# Patient Record
Sex: Female | Born: 1980 | Race: White | Hispanic: No | Marital: Married | Smoking: Never smoker
Health system: Southern US, Community
[De-identification: ages and names within clinical notes are randomized; demographics above are authoritative.]

## PROBLEM LIST (undated history)

## (undated) DIAGNOSIS — Z789 Other specified health status: Secondary | ICD-10-CM

---
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
## 2000-02-21 ENCOUNTER — Other Ambulatory Visit: Admission: RE | Admit: 2000-02-21 | Discharge: 2000-02-21 | Payer: Self-pay | Admitting: *Deleted

## 2001-03-19 ENCOUNTER — Other Ambulatory Visit: Admission: RE | Admit: 2001-03-19 | Discharge: 2001-03-19 | Payer: Self-pay | Admitting: *Deleted

## 2001-11-15 ENCOUNTER — Other Ambulatory Visit: Admission: RE | Admit: 2001-11-15 | Discharge: 2001-11-15 | Payer: Self-pay | Admitting: *Deleted

## 2002-04-14 HISTORY — PX: BACK SURGERY: SHX140

## 2002-09-02 ENCOUNTER — Encounter: Admission: RE | Admit: 2002-09-02 | Discharge: 2002-09-02 | Payer: Self-pay | Admitting: Chiropractic Medicine

## 2002-09-09 ENCOUNTER — Encounter: Payer: Self-pay | Admitting: Neurosurgery

## 2002-09-09 ENCOUNTER — Ambulatory Visit (HOSPITAL_COMMUNITY): Admission: RE | Admit: 2002-09-09 | Discharge: 2002-09-09 | Payer: Self-pay | Admitting: Neurosurgery

## 2002-11-30 ENCOUNTER — Other Ambulatory Visit: Admission: RE | Admit: 2002-11-30 | Discharge: 2002-11-30 | Payer: Self-pay | Admitting: Gynecology

## 2003-12-13 ENCOUNTER — Other Ambulatory Visit: Admission: RE | Admit: 2003-12-13 | Discharge: 2003-12-13 | Payer: Self-pay | Admitting: Gynecology

## 2004-11-25 ENCOUNTER — Other Ambulatory Visit: Admission: RE | Admit: 2004-11-25 | Discharge: 2004-11-25 | Payer: Self-pay | Admitting: Gynecology

## 2005-12-18 ENCOUNTER — Other Ambulatory Visit: Admission: RE | Admit: 2005-12-18 | Discharge: 2005-12-18 | Payer: Self-pay | Admitting: Gynecology

## 2006-12-28 ENCOUNTER — Other Ambulatory Visit: Admission: RE | Admit: 2006-12-28 | Discharge: 2006-12-28 | Payer: Self-pay | Admitting: Gynecology

## 2008-01-04 ENCOUNTER — Inpatient Hospital Stay (HOSPITAL_COMMUNITY): Admission: AD | Admit: 2008-01-04 | Discharge: 2008-01-06 | Payer: Self-pay | Admitting: Obstetrics and Gynecology

## 2008-01-04 ENCOUNTER — Encounter (INDEPENDENT_AMBULATORY_CARE_PROVIDER_SITE_OTHER): Payer: Self-pay | Admitting: Obstetrics and Gynecology

## 2008-01-07 ENCOUNTER — Encounter: Admission: RE | Admit: 2008-01-07 | Discharge: 2008-02-05 | Payer: Self-pay | Admitting: Obstetrics and Gynecology

## 2008-02-06 ENCOUNTER — Encounter: Admission: RE | Admit: 2008-02-06 | Discharge: 2008-03-07 | Payer: Self-pay | Admitting: Obstetrics and Gynecology

## 2008-03-08 ENCOUNTER — Encounter: Admission: RE | Admit: 2008-03-08 | Discharge: 2008-04-06 | Payer: Self-pay | Admitting: Obstetrics and Gynecology

## 2008-04-07 ENCOUNTER — Encounter: Admission: RE | Admit: 2008-04-07 | Discharge: 2008-05-07 | Payer: Self-pay | Admitting: Obstetrics and Gynecology

## 2008-05-08 ENCOUNTER — Encounter: Admission: RE | Admit: 2008-05-08 | Discharge: 2008-06-07 | Payer: Self-pay | Admitting: Obstetrics and Gynecology

## 2008-06-08 ENCOUNTER — Encounter: Admission: RE | Admit: 2008-06-08 | Discharge: 2008-07-05 | Payer: Self-pay | Admitting: Obstetrics and Gynecology

## 2008-07-06 ENCOUNTER — Encounter: Admission: RE | Admit: 2008-07-06 | Discharge: 2008-08-05 | Payer: Self-pay | Admitting: Obstetrics and Gynecology

## 2008-08-06 ENCOUNTER — Encounter: Admission: RE | Admit: 2008-08-06 | Discharge: 2008-09-04 | Payer: Self-pay | Admitting: Obstetrics and Gynecology

## 2008-09-05 ENCOUNTER — Encounter: Admission: RE | Admit: 2008-09-05 | Discharge: 2008-10-05 | Payer: Self-pay | Admitting: Obstetrics and Gynecology

## 2008-10-06 ENCOUNTER — Encounter: Admission: RE | Admit: 2008-10-06 | Discharge: 2008-11-04 | Payer: Self-pay | Admitting: Obstetrics and Gynecology

## 2008-11-05 ENCOUNTER — Encounter: Admission: RE | Admit: 2008-11-05 | Discharge: 2008-12-05 | Payer: Self-pay | Admitting: Obstetrics and Gynecology

## 2008-12-06 ENCOUNTER — Encounter: Admission: RE | Admit: 2008-12-06 | Discharge: 2008-12-12 | Payer: Self-pay | Admitting: Obstetrics and Gynecology

## 2009-07-13 ENCOUNTER — Other Ambulatory Visit: Admission: RE | Admit: 2009-07-13 | Discharge: 2009-07-13 | Payer: Self-pay | Admitting: Gynecology

## 2009-07-13 ENCOUNTER — Ambulatory Visit: Payer: Self-pay | Admitting: Women's Health

## 2010-03-27 ENCOUNTER — Ambulatory Visit: Payer: Self-pay | Admitting: Obstetrics and Gynecology

## 2010-03-27 ENCOUNTER — Inpatient Hospital Stay (HOSPITAL_COMMUNITY)
Admission: AD | Admit: 2010-03-27 | Discharge: 2010-03-27 | Payer: Self-pay | Source: Home / Self Care | Attending: Obstetrics and Gynecology | Admitting: Obstetrics and Gynecology

## 2010-04-03 ENCOUNTER — Ambulatory Visit: Payer: Self-pay | Admitting: Obstetrics and Gynecology

## 2010-04-10 ENCOUNTER — Ambulatory Visit: Payer: Self-pay | Admitting: Obstetrics & Gynecology

## 2010-04-10 ENCOUNTER — Ambulatory Visit: Payer: Self-pay | Admitting: Obstetrics and Gynecology

## 2010-04-17 ENCOUNTER — Ambulatory Visit: Admit: 2010-04-17 | Payer: Self-pay | Admitting: Obstetrics and Gynecology

## 2010-04-17 ENCOUNTER — Ambulatory Visit
Admission: RE | Admit: 2010-04-17 | Discharge: 2010-04-17 | Payer: Self-pay | Source: Home / Self Care | Attending: Obstetrics and Gynecology | Admitting: Obstetrics and Gynecology

## 2010-04-24 ENCOUNTER — Ambulatory Visit
Admission: RE | Admit: 2010-04-24 | Discharge: 2010-04-24 | Payer: Self-pay | Source: Home / Self Care | Attending: Obstetrics and Gynecology | Admitting: Obstetrics and Gynecology

## 2010-04-24 ENCOUNTER — Ambulatory Visit: Admit: 2010-04-24 | Payer: Self-pay | Admitting: Obstetrics and Gynecology

## 2010-05-01 ENCOUNTER — Ambulatory Visit: Admit: 2010-05-01 | Payer: Self-pay | Admitting: Obstetrics and Gynecology

## 2010-05-02 ENCOUNTER — Ambulatory Visit: Admit: 2010-05-02 | Payer: Self-pay | Admitting: Obstetrics and Gynecology

## 2010-05-02 ENCOUNTER — Ambulatory Visit
Admission: RE | Admit: 2010-05-02 | Discharge: 2010-05-02 | Payer: Self-pay | Source: Home / Self Care | Attending: Obstetrics and Gynecology | Admitting: Obstetrics and Gynecology

## 2010-05-04 ENCOUNTER — Inpatient Hospital Stay (HOSPITAL_COMMUNITY)
Admission: AD | Admit: 2010-05-04 | Discharge: 2010-05-07 | Payer: Self-pay | Source: Home / Self Care | Attending: Obstetrics & Gynecology | Admitting: Obstetrics & Gynecology

## 2010-05-04 ENCOUNTER — Encounter (INDEPENDENT_AMBULATORY_CARE_PROVIDER_SITE_OTHER): Payer: Self-pay | Admitting: Obstetrics & Gynecology

## 2010-05-07 ENCOUNTER — Encounter
Admission: RE | Admit: 2010-05-07 | Discharge: 2010-05-14 | Payer: Self-pay | Source: Home / Self Care | Attending: Obstetrics and Gynecology | Admitting: Obstetrics and Gynecology

## 2010-05-07 LAB — CBC
HCT: 31.7 % — ABNORMAL LOW (ref 36.0–46.0)
Hemoglobin: 7.8 g/dL — ABNORMAL LOW (ref 12.0–15.0)
MCH: 27.9 pg (ref 26.0–34.0)
MCHC: 32.5 g/dL (ref 30.0–36.0)
MCHC: 32.5 g/dL (ref 30.0–36.0)
RDW: 13.2 % (ref 11.5–15.5)

## 2010-05-07 LAB — SAMPLE TO BLOOD BANK

## 2010-05-22 NOTE — Op Note (Signed)
Kristina Fernandez, Kristina Fernandez                 ACCOUNT NO.:  [id number]  MEDICAL RECORD NO.:  [id number]          PATIENT TYPE:  INP  LOCATION:  9104                          FACILITY:  [facility]  PHYSICIAN:  Maxie Better, M.D.DATE OF BIRTH:  07-May-1980  DATE OF PROCEDURE: DATE OF DISCHARGE:                              OPERATIVE REPORT   PREOPERATIVE DIAGNOSES: 1. Twin gestation at 38-1/7th weeks. 2. Vertex-breech presentation.  PROCEDURES: 1. Primary cesarean section, Kerr hysterotomy.  POSTOPERATIVE DIAGNOSES: 1. Vertex-footling breech presentation. 2. Twin gestation at 38-1/7th weeks.  ANESTHESIA:  Spinal.  SURGEON:  Maxie Better, MD  ASSISTANT:  [name], CNM  INDICATIONS:  A 30 year old gravida 2, para 1-0-0-1 married white female at 38-1/7th weeks with di-di twin gestation, who was admitted for induction of labor.  The second twin was noted to be breech presentation without the clarity as to the type of breech.  After a long discussion about options, the patient opted for primary cesarean section.  She was known to have the favorable cervix for induction to begin with. Surgical risk was reviewed with the patient, consent was signed.  The patient was transferred to the operating room.  DESCRIPTION OF PROCEDURE:  Under adequate spinal anesthesia, the patient was placed in the supine position with a left lateral tilt.  She was sterilely prepped and draped in usual fashion.  Indwelling Foley catheter was sterilely placed.  A 0.25% Marcaine was injected along the planned Pfannenstiel skin incision site.  Pfannenstiel skin incision was then made, carried down to the rectus fascia.  The rectus fascia was opened transversely.  The rectus fascia was bluntly and sharply dissected off the rectus muscle in superior and inferior fashion.  The rectus muscles were split in the midline.  The parietal peritoneum was entered sharply and extended.  The lower segment was noted to  have a large venous lakes.  Vesicouterine peritoneum was opened transversely. The bladder was then bluntly dissected off the lower uterine segment, displaced inferiorly with a bladder retractor.  A curvilinear low transverse incision was then made and extended with bandage scissors. Artificial rupture of membranes occurred at that time.  Large amount of bleeding was noted due to those vessels that had been present. Subsequent delivery of a live female was accomplished from the vertex position, compound presentation.  The baby was bulb suctioned on the abdomen and cord was clamped and cut.  The baby was transferred to the awaiting pediatrician who assigned Apgars 8 and 10 at 1 and 5 minutes. The second twin sac was then noted, footling breech was noted.  Both feet was grasped.  Artificial rupture of membranes was then performed and subsequent delivery of a live female using breech maneuvers was accomplished.  The baby was bulb suctioned on the abdomen.  The cord was clamped, cut.  The baby was transferred to the awaiting pediatrician who assigned Apgars of 8 and 9 at 1 and 5 minutes.  The placenta times two which  were anterior was  manually removed.  Uterine cavity was cleaned of debris.  Uterine incision had no extension, but several pumping vessels.  The uterine  incision was quickly closed with 0 Monocryl running lock stitch first layer, second layer was imbricating 0 Monocryl suture.  Good hemostasis noted along the incision line. Normal tube and ovaries were noted bilaterally.  The abdomen was copiously irrigated and suctioned of debris.  The paracolic gutters were cleaned of debris as well.  The parietal peritoneum was subsequently closed with 2-0 Vicryl.  The rectus fascia was closed with 0 Vicryl x2.  The subcutaneous area was irrigated, small bleeders cauterized.  Interrupted 2-0 plain sutures placed and the skin approximated with a subcuticular closure using 4-0 Monocryl  suture.  SPECIMENS:  Placenta x2 sent to Pathology.  INTRAOPERATIVE FLUID:  2500 mL crystalloid.  URINE OUTPUT:  250 mL, clear yellow urine.  ESTIMATED BLOOD LOSS:  1000 mL.  Sponge and instrument counts was correct.  COMPLICATIONS:  None.  Weight of the baby on twin A was 6 pounds and 10 ounces.  Twin B was 7 pounds and 4 ounces.  Both babies went to the central nursery.  The patient tolerated the procedure well and was transferred to recovery room in stable condition.     Maxie Better, M.D.     /MEDQ  D:  05/04/2010  T:  05/05/2010  Job:  [id number]  Electronically Signed by Nena Jordan Vannah Nadal M.D. on 05/22/2010 07:54:47 AM

## 2010-06-02 NOTE — Discharge Summary (Signed)
Kristina Fernandez, Kristina Fernandez NO.:  [id number]  MEDICAL RECORD NO.:  [id number]          PATIENT TYPE:  INP  LOCATION:  9104                          FACILITY:  [facility]  PHYSICIAN:  Arlan Organ, MD       DATE OF BIRTH:  [birth year]  DATE OF ADMISSION:  05/04/2010 DATE OF DISCHARGE:  05/07/2010                              DISCHARGE SUMMARY   PREOPERATIVE DIAGNOSES:  Intrauterine pregnancy, twin gestation at term, induction of labor.  POSTOPERATIVE DIAGNOSES:  Status post primary C-section, twin A vertex, twin B footling breech.  HISTORY:  The patient is a gravida 2, para 1-0-0-1 at 38 weeks and 1 day gestation with an [facility] of May 17, 2010.  Prenatal care was obtained at [facility] since 9 weeks, [place] was [place], [place].  PRENATAL LABS:  O+, Rubella immune, GBS negative, HIV nonreactive, RPR nonreactive, hepatitis B negative, 1-hour GTT normal at 118.  Prenatal course complicated by a diamniotic-dichorionic twins with concordant growth.  MEDICAL/SURGICAL HISTORY:  The patient has a known history of anemia and an L4-L5 fusion.  ALLERGIES:  No known drug allergies.  CURRENT MEDICATIONS:  Prenatal vitamins.  PRESENTATION:  The patient presents to the hospital for induction of labor.  The patient desires spontaneous vaginal delivery of twins. During admission assessment, twin B was found to be footling breech. Plan is changed to primary cesarean section for contraindication of vaginal delivery of twin B footling breech.  ADMISSION LABS:  WBC 9.1, hemoglobin 10.3, hematocrit 31.7, and platelets 245, RPR nonreactive.    SURGERY: The patient was delivered via cesarean section by Dr. Cherly Hensen on May 04, 2010, for twin gestation with twin A vertex, twin B footling breecH; please see operative report for further  details. The patient was delivered of twin A female with a weight of 6 pounds  10 ounces and Apgars of 8 and 10; twin B female with a weight of 7 pounds  5 ounces  and Apgar of 8 and 9. Newborns were transferred to regular nursery.  POSTOP COURSE:  Postop course was complicated by chronic anemia with superimposed ABL anemia.  POSTOP LABS:  WBCs 11.4, hemoglobin 7.8, hematocrit 24.0, and platelets 192.  Vital signs stable and the patient remained afebrile throughout postop course.  Physical exam was within normal limits.  Wound was approximated with subcutaneos suture and Steri-Strips.  No erythema and no ecchymosis at incision line.  Newborns were breastfed.  Twin A female underwent circumcision during hospital stay.  DISCHARGE CARE AND INSTRUCTIONS:  The patient was discharged home on postop day #3 in stable condition.  Diet regular.  Activity ad lib and postop weight lifting restrictions x2 weeks.  Instructions per [facility] booklet.  DISCHARGE MEDICATIONS:  The patient was discharged home on  1. ibuprofen 800 mg q.8 h. p.r.n. for pain,  2. Tylox 1-2 tablets q.4-6 h. p.r.n. for pain, 3. Niferex 150 one tablet b.i.d., and  4. Colace 100 mg p.o. b.i.d.  FOLLOW-UP:  In 6 weeks postpartum at [facility].          ______________________________ Arlan Organ, CNM     DP/MEDQ  D:  05/07/2010  T:  05/08/2010  Job:  [id number]  Electronically Signed by Arlan Organ CNM on 05/09/2010 11:30:16 AM Electronically Signed by Nena Jordan Yan Pankratz M.D. on 06/02/2010 04:42:26 PM

## 2010-06-07 ENCOUNTER — Ambulatory Visit (HOSPITAL_COMMUNITY)
Admission: RE | Admit: 2010-06-07 | Discharge: 2010-06-07 | Disposition: A | Discharge status: Home / Self Care | Payer: Self-pay | Source: Ambulatory Visit | Attending: Obstetrics and Gynecology | Admitting: Obstetrics and Gynecology

## 2010-06-07 ENCOUNTER — Encounter (HOSPITAL_COMMUNITY): Payer: Self-pay

## 2010-06-07 ENCOUNTER — Encounter (HOSPITAL_COMMUNITY): Payer: Self-pay | Attending: Obstetrics and Gynecology

## 2010-06-07 DIAGNOSIS — O923 Agalactia: Secondary | ICD-10-CM | POA: Insufficient documentation

## 2010-06-25 LAB — FETAL FIBRONECTIN: Fetal Fibronectin: NEGATIVE

## 2010-07-08 ENCOUNTER — Encounter (HOSPITAL_COMMUNITY)
Admission: RE | Admit: 2010-07-08 | Discharge: 2010-07-08 | Disposition: A | Discharge status: Home / Self Care | Payer: Self-pay | Source: Ambulatory Visit | Attending: Obstetrics and Gynecology | Admitting: Obstetrics and Gynecology

## 2010-07-08 DIAGNOSIS — O923 Agalactia: Secondary | ICD-10-CM | POA: Insufficient documentation

## 2010-08-07 ENCOUNTER — Encounter (HOSPITAL_COMMUNITY)
Admission: RE | Admit: 2010-08-07 | Discharge: 2010-08-07 | Disposition: A | Discharge status: Home / Self Care | Payer: 59 | Source: Ambulatory Visit | Attending: Obstetrics and Gynecology | Admitting: Obstetrics and Gynecology

## 2010-08-07 DIAGNOSIS — O923 Agalactia: Secondary | ICD-10-CM | POA: Insufficient documentation

## 2010-08-30 NOTE — Op Note (Signed)
NAME:  Kristina Fernandez, Kristina Fernandez                          ACCOUNT NO.:  [id number]   MEDICAL RECORD NO.:  [id number]                   PATIENT TYPE:  OIB   LOCATION:  3014                                 FACILITY:  [facility]   PHYSICIAN:  Kathaleen Maser. Pool, M.D.                 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   PROCEDURES PERFORMED:  Right L4-L5 laminotomy and microdiskectomy.   SURGEON:  Kathaleen Maser. Pool, M.D.   ASSISTANT:  Donalee Citrin, M.D.   ANESTHESIA:  General endotracheal.   INDICATIONS:  This patient is a 29 year old female with history of back and  right lower extremity pain failing conservative management.  An MRI scan  demonstrates a very large right paracentral disk herniation at L4-L5 with  compression of the thecal sac and extended right-sided L5 nerve root.  The  patient had been counseled as to her options.  She has decided to proceed  with a right-sided L4-L5 laminotomy and microdiskectomy for hopeful  improvement of her symptoms.   DESCRIPTION OF PROCEDURE:  The patient was brought the operating room and  placed on the operating table in the supine position.  After an adequate  level of anesthesia was achieved, the patient was placed prone on a Wilson  frame.  Appropriate padding was placed.  The patient's back is prepped and  draped sterilely.  A #10 blade was used to make a skin incision overlying  the L5-L5 interspace.  This was carried down sharply in the midline.  Subperiosteal dissection was then performed exposing the laminae and facet  joints of L4-L5 on the right.  Deep self-retaining retractors were placed.  An x-ray was taken, and the level was confirmed.  A laminotomy was then  performed  using the high-speed drill and Kerrison rongeurs to remove the  inferior edge of the lamina of L5, the medial edge of the L4-L5 facet joint,  and the superior rim of the L5 lamina.  Ligamentum flavum was then elevated  and resected in piecemeal fashion using the Kerrison rongeurs.  The  underlying thecal sac was identified.  Microscope was brought into the field  and used for microdissection of the right-sided L5 nerve root and underlying  disk herniation.  Epidural needle was cut.  This was coagulated and cut.  Thecal sac and L5 nerve root were gently mobilized towards the midline.  A  very large disk herniation was encountered.  This was incised with a  #15  blade in retractor fashion.  A wide disk space cleanout was then achieved  using pituitary rongeurs, upbiting pituitary rongeurs, and Epstein curettes.  All elements of disk herniation were  completely resected.  All obviously  degenerative disk material was removed from the interspace.  After a very  thorough diskectomy was achieved, the wound was irrigated with antibiotic  solution.  Thecal sac and nerve roots were inspected and found to be free  from any compression or injury.  Gelfoam was placed topically for hemostasis  which was found to be good.  The wound  was inspected for hemostasis one final time and then closed in a typical  fashion.  Steri-Strips and sterile dressing were applied.  There were no  complications.  The patient tolerated the procedure well, and she returned  to the recovery room postoperatively.                                               Henry A. Pool, M.D.    HAP/MEDQ  D:  09/09/2002  T:  09/10/2002  Job:  [id number]

## 2010-09-07 ENCOUNTER — Encounter (HOSPITAL_COMMUNITY)
Admission: RE | Admit: 2010-09-07 | Discharge: 2010-09-07 | Disposition: A | Discharge status: Home / Self Care | Payer: 59 | Source: Ambulatory Visit | Attending: Obstetrics and Gynecology | Admitting: Obstetrics and Gynecology

## 2010-09-07 DIAGNOSIS — O923 Agalactia: Secondary | ICD-10-CM | POA: Insufficient documentation

## 2010-10-08 ENCOUNTER — Encounter (HOSPITAL_COMMUNITY)
Admission: RE | Admit: 2010-10-08 | Discharge: 2010-10-08 | Disposition: A | Discharge status: Home / Self Care | Payer: Self-pay | Source: Ambulatory Visit | Attending: Obstetrics and Gynecology | Admitting: Obstetrics and Gynecology

## 2010-10-08 DIAGNOSIS — O923 Agalactia: Secondary | ICD-10-CM | POA: Insufficient documentation

## 2010-11-08 ENCOUNTER — Encounter (HOSPITAL_COMMUNITY)
Admission: RE | Admit: 2010-11-08 | Discharge: 2010-11-08 | Disposition: A | Discharge status: Home / Self Care | Payer: Self-pay | Source: Ambulatory Visit | Attending: Obstetrics and Gynecology | Admitting: Obstetrics and Gynecology

## 2010-11-08 DIAGNOSIS — O923 Agalactia: Secondary | ICD-10-CM | POA: Insufficient documentation

## 2010-12-09 ENCOUNTER — Ambulatory Visit (HOSPITAL_COMMUNITY)
Admission: RE | Admit: 2010-12-09 | Discharge: 2010-12-09 | Disposition: A | Discharge status: Home / Self Care | Payer: Self-pay | Source: Ambulatory Visit | Attending: Obstetrics and Gynecology | Admitting: Obstetrics and Gynecology

## 2010-12-09 DIAGNOSIS — O923 Agalactia: Secondary | ICD-10-CM | POA: Insufficient documentation

## 2011-01-09 ENCOUNTER — Encounter (HOSPITAL_COMMUNITY)
Admission: RE | Admit: 2011-01-09 | Discharge: 2011-01-09 | Disposition: A | Discharge status: Home / Self Care | Payer: 59 | Source: Ambulatory Visit | Attending: Obstetrics and Gynecology | Admitting: Obstetrics and Gynecology

## 2011-01-09 DIAGNOSIS — O923 Agalactia: Secondary | ICD-10-CM | POA: Insufficient documentation

## 2011-01-13 LAB — CBC
HCT: 36.3
MCHC: 33.7
MCHC: 33.8
MCV: 89.7
Platelets: 186
Platelets: 241
RDW: 12.6
RDW: 12.7

## 2011-02-09 ENCOUNTER — Encounter (HOSPITAL_COMMUNITY)
Admission: RE | Admit: 2011-02-09 | Discharge: 2011-02-09 | Disposition: A | Discharge status: Home / Self Care | Payer: 59 | Source: Ambulatory Visit | Attending: Obstetrics and Gynecology | Admitting: Obstetrics and Gynecology

## 2011-02-09 DIAGNOSIS — O923 Agalactia: Secondary | ICD-10-CM | POA: Insufficient documentation

## 2011-03-12 ENCOUNTER — Encounter (HOSPITAL_COMMUNITY)
Admission: RE | Admit: 2011-03-12 | Discharge: 2011-03-12 | Disposition: A | Discharge status: Home / Self Care | Payer: 59 | Source: Ambulatory Visit | Attending: Obstetrics and Gynecology | Admitting: Obstetrics and Gynecology

## 2011-03-12 DIAGNOSIS — O923 Agalactia: Secondary | ICD-10-CM | POA: Insufficient documentation

## 2011-05-06 IMAGING — US US FETAL BPP W/O NONSTRESS
1 series · 13 of 28 positions shown · non-contrast
Comparison: none

[Series 1: us fetal bpp w/o nonstress · non-contrast · 47 acquisitions, 13 frames shown]
[im 2/47]
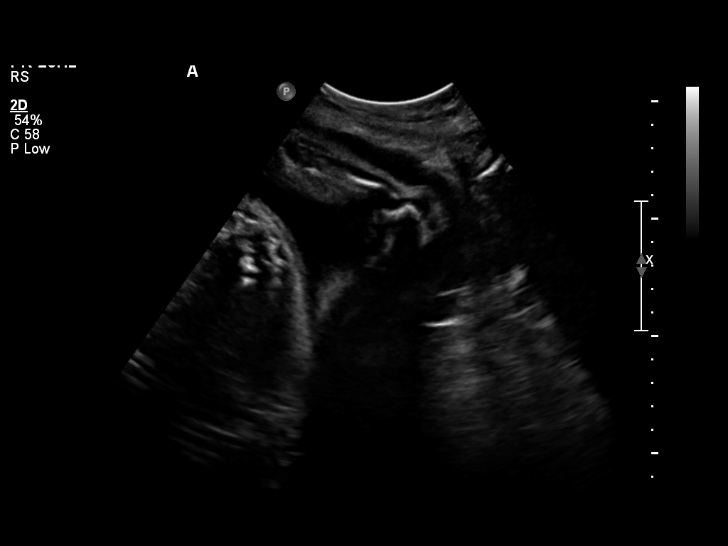
[im 6/47]
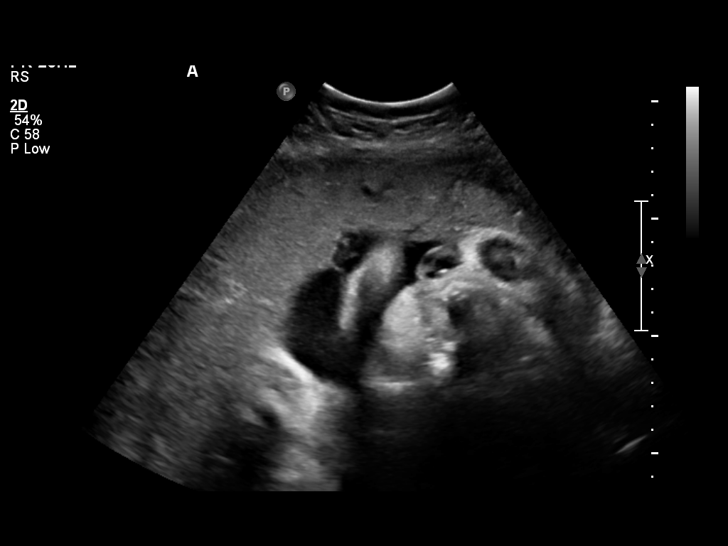
[im 9/47]
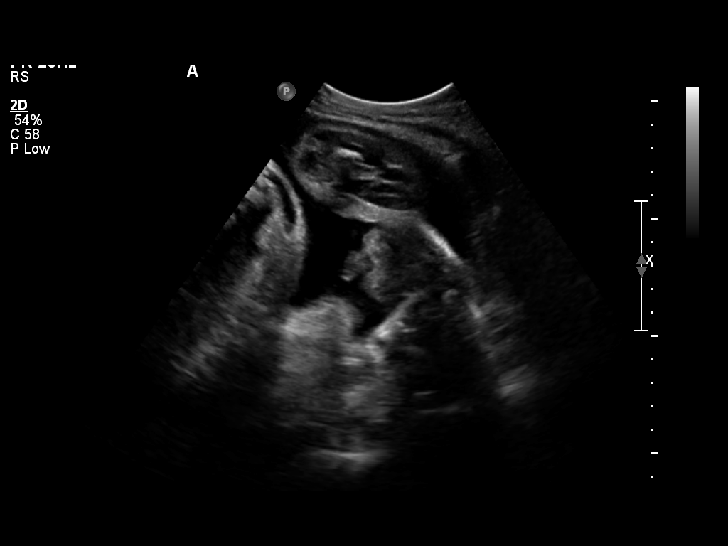
[im 12/47]
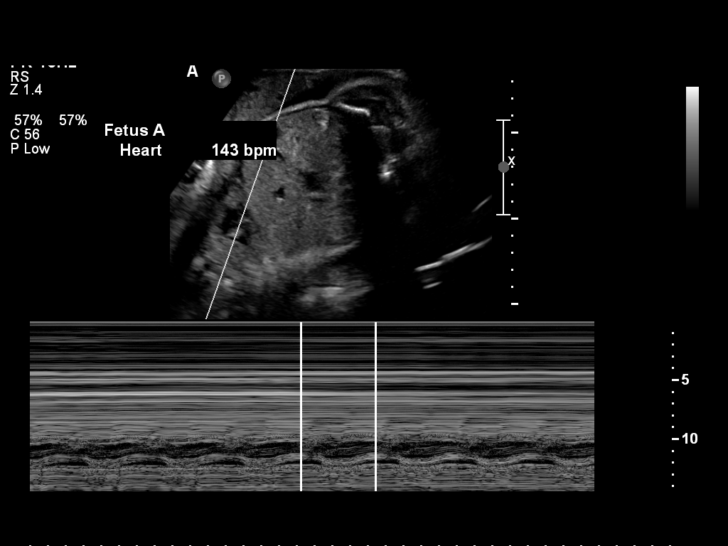
[im 16/47]
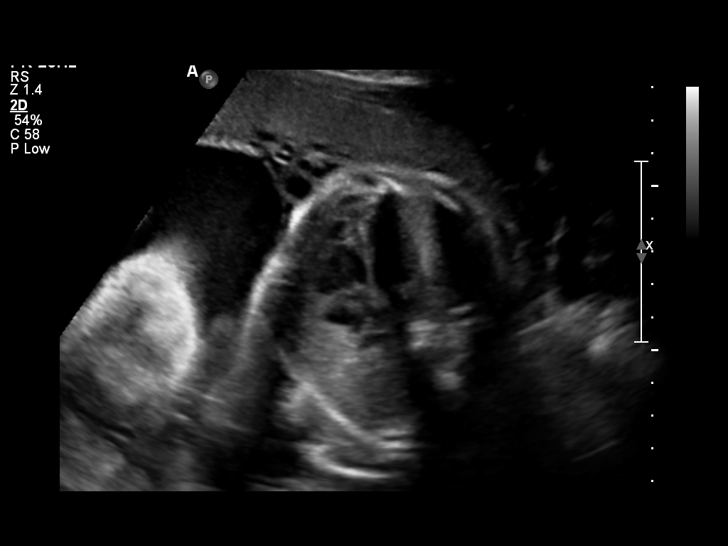
[im 19/47]
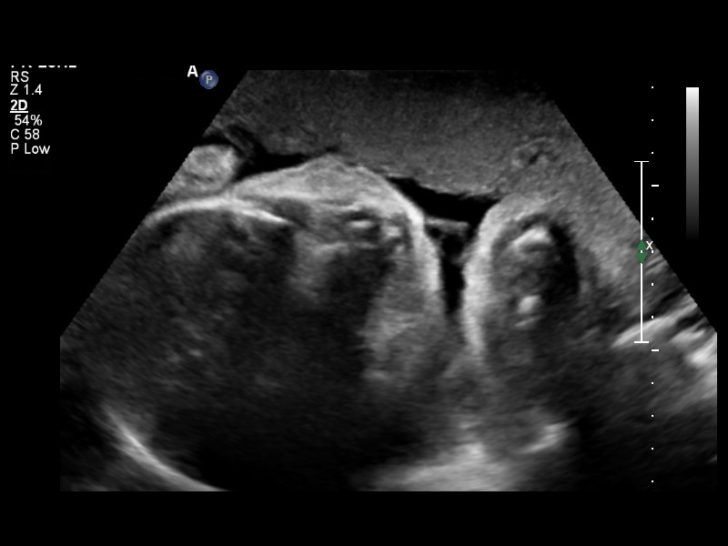
[im 24/47]
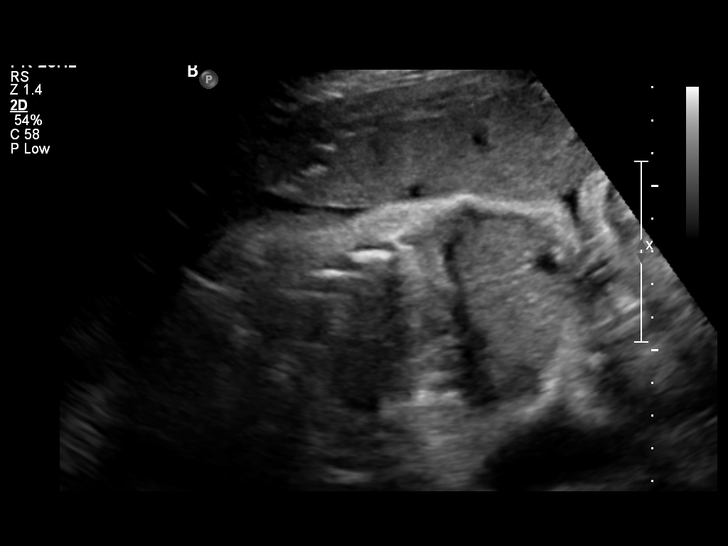
[im 28/47]
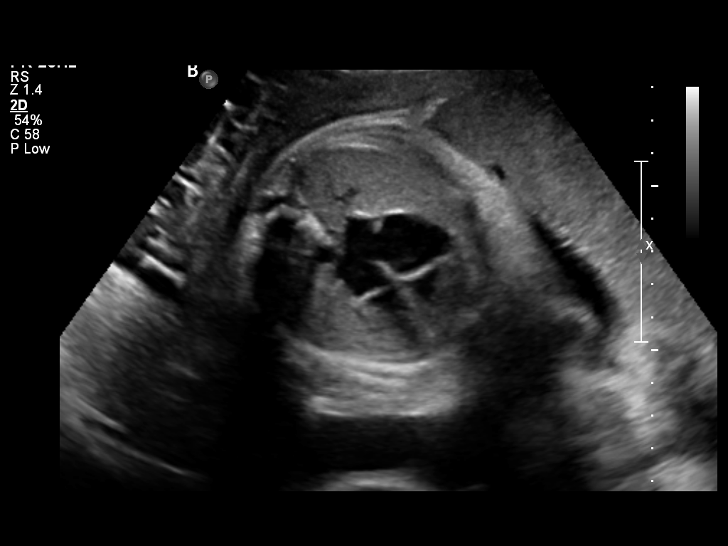
[im 31/47]
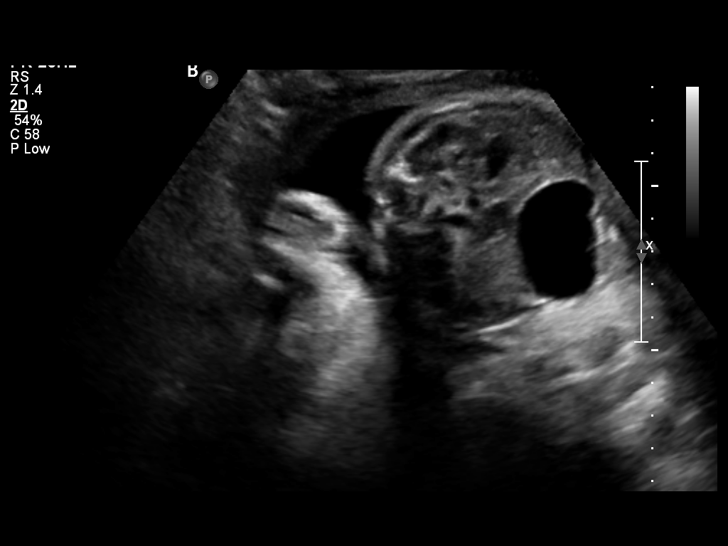
[im 35/47]
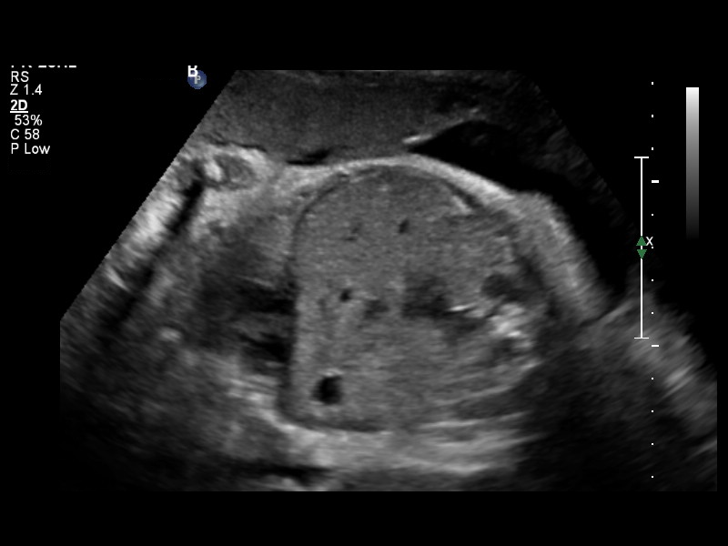
[im 38/47]
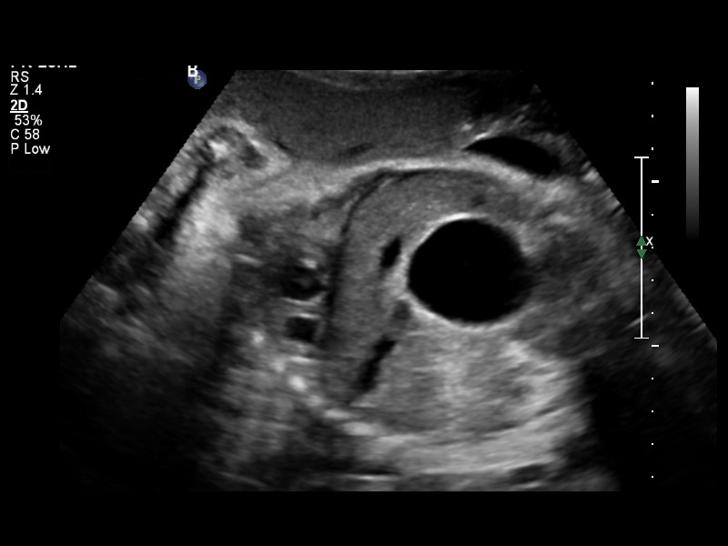
[im 41/47]
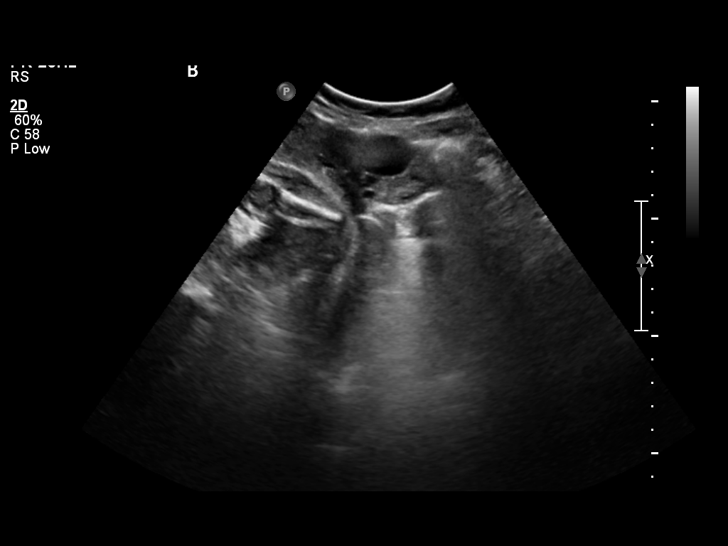
[im 45/47]
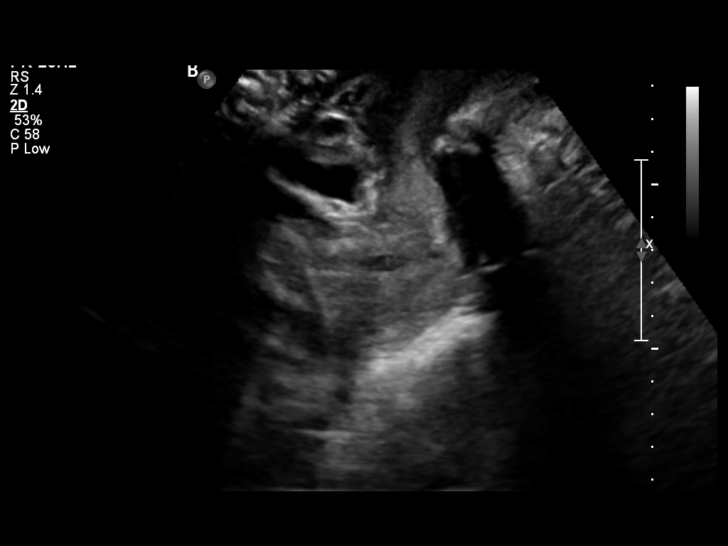

[13 of 28 positions shown; findings below may reference images not displayed]

OBSTETRICS REPORT
                      (Signed Final 03/27/2010 [DATE])

 Order#:         [id number]
                 [id number]
Procedures

 US FETAL BPP W/O NONSTRESS                            76819.0
 US FETAL BPP ADD'L GEST                               76819.2
 [HOSPITAL]                                         74320
Indications

 Non-reactive NST, FHR variables
 Twin gestation, Di-Di
Fetal Evaluation (Fetus A)

 Fetal Heart Rate:  143                          bpm
 Cardiac Activity:  Observed
 Fetal Lie:         Maternal left side
 Presentation:      Breech, complete
 Placenta:          Anterior, above cervical os

 Membrane Desc:     Dividing
                    Membrane seen

 Amniotic Fluid
 AFI FV:      Subjectively within normal limits
                                             Larg Pckt:     2.6  cm
Biophysical Evaluation (Fetus A)

 Amniotic F.V:   Pocket => 2 cm two         F. Tone:        Observed
                 planes
 F. Movement:    Observed                   Score:          [DATE]
 F. Breathing:   Observed
Gestational Age (Fetus A)

 Clinical EDD:  32w 5d                                        EDD:   05/17/10
 Best:          32w 5d     Det. By:  Clinical EDD             EDD:   05/17/10

Fetal Evaluation (Fetus B)

 Fetal Heart Rate:  129                          bpm
 Cardiac Activity:  Observed
 Fetal Lie:         Maternal right side
 Presentation:      Frank breech
 Placenta:          Anterior, above cervical os

 Amniotic Fluid
 AFI FV:      Subjectively within normal limits
Biophysical Evaluation (Fetus B)

 Amniotic F.V:   Pocket => 2 cm two         F. Tone:        Observed
                 planes
 F. Movement:    Observed                   Score:          [DATE]
 F. Breathing:   Observed
Gestational Age (Fetus B)

 Clinical EDD:  32w 5d                                        EDD:   05/17/10
 Best:          32w 5d     Det. By:  Clinical EDD             EDD:   05/17/10
Cervix Uterus Adnexa

 Cervical Length:    3.84     cm

 Cervix:       Measured translabially.

 Adnexa:     No abnormality visualized.
Impression

 Living twin gestation with presenting twin A maternal left in
 frank breech presentation. BPP [DATE] for both twins.

 questions or concerns.

## 2011-07-03 ENCOUNTER — Encounter: Payer: Self-pay | Admitting: Women's Health

## 2011-07-03 ENCOUNTER — Ambulatory Visit (INDEPENDENT_AMBULATORY_CARE_PROVIDER_SITE_OTHER): Payer: 59 | Admitting: Women's Health

## 2011-07-03 ENCOUNTER — Other Ambulatory Visit (HOSPITAL_COMMUNITY)
Admission: RE | Admit: 2011-07-03 | Discharge: 2011-07-03 | Disposition: A | Discharge status: Home / Self Care | Payer: 59 | Source: Ambulatory Visit | Attending: Obstetrics and Gynecology | Admitting: Obstetrics and Gynecology

## 2011-07-03 VITALS — BP 90/62 | Ht 64.0 in | Wt 144.0 lb

## 2011-07-03 DIAGNOSIS — Z01419 Encounter for gynecological examination (general) (routine) without abnormal findings: Secondary | ICD-10-CM | POA: Insufficient documentation

## 2011-07-03 DIAGNOSIS — R5381 Other malaise: Secondary | ICD-10-CM

## 2011-07-03 DIAGNOSIS — R5383 Other fatigue: Secondary | ICD-10-CM

## 2011-07-03 DIAGNOSIS — E079 Disorder of thyroid, unspecified: Secondary | ICD-10-CM

## 2011-07-03 LAB — CBC WITH DIFFERENTIAL/PLATELET
Lymphocytes Relative: 21 % (ref 12–46)
Lymphs Abs: 2.1 10*3/uL (ref 0.7–4.0)
Neutrophils Relative %: 68 % (ref 43–77)
Platelets: 306 10*3/uL (ref 150–400)
RBC: 4.33 MIL/uL (ref 3.87–5.11)
WBC: 9.6 10*3/uL (ref 4.0–10.5)

## 2011-07-03 NOTE — Progress Notes (Signed)
Kristina Fernandez [birth year] [id number]    History:    The patient presents for annual exam.  Irregular cycle breast-feeding twins that are 14 months/ weaning. Condoms for contraception, husband scheduled for vasectomy next week. History of normal Paps.   Past medical history, past surgical history, family history and social history were all reviewed and documented in the EPIC chart. Twins Kristina Fernandez and Kristina Fernandez, 33-year-old daughter Kristina Fernandez doing well.   ROS:  A  ROS was performed and pertinent positives and negatives are included in the history.  Exam:  Filed Vitals:   07/03/11 1047  BP: 90/62    General appearance:  Normal Head/Neck:  Normal, without cervical or supraclavicular adenopathy. Thyroid:  Symmetrical, normal in size, without palpable masses or nodularity. Respiratory  Effort:  Normal  Auscultation:  Clear without wheezing or rhonchi Cardiovascular  Auscultation:  Regular rate, without rubs, murmurs or gallops  Edema/varicosities:  Not grossly evident Abdominal  Soft,nontender, without masses, guarding or rebound.  Liver/spleen:  No organomegaly noted  Hernia:  None appreciated  Skin  Inspection:  Grossly normal  Palpation:  Grossly normal Neurologic/psychiatric  Orientation:  Normal with appropriate conversation.  Mood/affect:  Normal  Genitourinary    Breasts: Examined lying and sitting/pendulous-lactating.     Right: Without masses, retractions, discharge or axillary adenopathy.     Left: Without masses, retractions, discharge or axillary adenopathy.   Inguinal/mons:  Normal without inguinal adenopathy  External genitalia:  Normal  BUS/Urethra/Skene's glands:  Normal  Bladder:  Normal  Vagina:  Normal  Cervix:  Normal  Uterus:   normal in size, shape and contour.  Midline and mobile  Adnexa/parametria:     Rt: Without masses or tenderness.   Lt: Without masses or tenderness.  Anus and perineum: Normal  Digital rectal exam: Normal sphincter tone without palpated  masses or tenderness  Assessment/Plan:  31 y.o. MWF G2 P3 for annual exam without complaint other than exhaustion..  Normal GYN exam Contraception counseling Situational fatigue  Plan: Contraception options reviewed, will continue condoms until negative sperm count. SBE's, continue MVI daily, encouraged soliciting help from family members to help with children (3 under 3). CBC, TSH, glucose, Pap.    Harrington Challenger [facility], 12:15 PM 07/03/2011

## 2011-07-03 NOTE — Patient Instructions (Signed)

## 2011-07-04 LAB — GLUCOSE, RANDOM: Glucose, Bld: 75 mg/dL (ref 70–99)

## 2013-01-11 ENCOUNTER — Ambulatory Visit (INDEPENDENT_AMBULATORY_CARE_PROVIDER_SITE_OTHER): Payer: 59 | Admitting: Women's Health

## 2013-01-11 ENCOUNTER — Encounter: Payer: Self-pay | Admitting: Women's Health

## 2013-01-11 VITALS — BP 110/70 | Ht 63.75 in | Wt 144.0 lb

## 2013-01-11 DIAGNOSIS — Z309 Encounter for contraceptive management, unspecified: Secondary | ICD-10-CM

## 2013-01-11 DIAGNOSIS — Z01419 Encounter for gynecological examination (general) (routine) without abnormal findings: Secondary | ICD-10-CM

## 2013-01-11 DIAGNOSIS — Z13220 Encounter for screening for lipoid disorders: Secondary | ICD-10-CM

## 2013-01-11 DIAGNOSIS — Z833 Family history of diabetes mellitus: Secondary | ICD-10-CM

## 2013-01-11 DIAGNOSIS — IMO0001 Reserved for inherently not codable concepts without codable children: Secondary | ICD-10-CM

## 2013-01-11 LAB — CBC WITH DIFFERENTIAL/PLATELET
Basophils Absolute: 0 10*3/uL (ref 0.0–0.1)
Eosinophils Absolute: 0.2 10*3/uL (ref 0.0–0.7)
Lymphocytes Relative: 35 % (ref 12–46)
Lymphs Abs: 2.4 10*3/uL (ref 0.7–4.0)
MCH: 29.8 pg (ref 26.0–34.0)
Neutrophils Relative %: 53 % (ref 43–77)
Platelets: 272 10*3/uL (ref 150–400)
RBC: 4.23 MIL/uL (ref 3.87–5.11)
RDW: 13.1 % (ref 11.5–15.5)
WBC: 6.9 10*3/uL (ref 4.0–10.5)

## 2013-01-11 MED ORDER — NORETHIN ACE-ETH ESTRAD-FE 1-20 MG-MCG PO TABS: 1.0000 | ORAL_TABLET | Freq: Every day | ORAL | Status: DC

## 2013-01-11 NOTE — Progress Notes (Signed)
Kristina Fernandez 1980/09/16 [id number]    History:    The patient presents for annual exam.  Monthly cycle condoms, husband planning vasectomy but has not had. Normal Pap history. Having stress incontinence, struggling with fatigue caring for 3 small children and working.   Past medical history, past surgical history, family history and social history were all reviewed and documented in the EPIC chart. Twins Kristina Fernandez and Kristina Fernandez 2-1/2, Kristina Fernandez- 5 doing well. Works in Educational psychologist.   ROS:  A  ROS was performed and pertinent positives and negatives are included in the history.  Exam:  Filed Vitals:   01/11/13 1521  BP: 110/70    General appearance:  Normal Head/Neck:  Normal, without cervical or supraclavicular adenopathy. Thyroid:  Symmetrical, normal in size, without palpable masses or nodularity. Respiratory  Effort:  Normal  Auscultation:  Clear without wheezing or rhonchi Cardiovascular  Auscultation:  Regular rate, without rubs, murmurs or gallops  Edema/varicosities:  Not grossly evident Abdominal  Soft,nontender, without masses, guarding or rebound.  Liver/spleen:  No organomegaly noted  Hernia:  None appreciated  Skin  Inspection:  Grossly normal  Palpation:  Grossly normal Neurologic/psychiatric  Orientation:  Normal with appropriate conversation.  Mood/affect:  Normal  Genitourinary    Breasts: Examined lying and sitting.     Right: Without masses, retractions, discharge or axillary adenopathy.     Left: Without masses, retractions, discharge or axillary adenopathy.   Inguinal/mons:  Normal without inguinal adenopathy  External genitalia:  Normal  BUS/Urethra/Skene's glands:  Normal  Bladder:  Normal  Vagina:  Normal  Cervix:  Normal  Uterus:   normal in size, shape and contour.  Midline and mobile  Adnexa/parametria:     Rt: Without masses or tenderness.   Lt: Without masses or tenderness.  Anus and perineum: Normal  Digital rectal exam: Normal sphincter tone  without palpated masses or tenderness  Assessment/Plan:  32 y.o. MWF G2P3 for annual exam.     Normal GYN exam  Contraception management Occasional stress incontinence  Plan: Contraception options reviewed, would like pills. Had been on in the past without problem. Loestrin 1/20 prescription, proper use given and reviewed slight risk for blood clots and strokes. Start with next cycle take daily, condoms first month. [place] exercise reviewed, SBE's, continue regular exercise, empty bladder prior to exercise encouraged, calcium rich diet, MVI daily encouraged. Reviewed importance of delegating at home, allowing family members to help. CBC, glucose, lipid panel, UA, Pap normal 2013, new screening guidelines reviewed.    Harrington Challenger [facility], 5:23 PM 01/11/2013

## 2013-01-11 NOTE — Patient Instructions (Signed)

## 2013-01-12 LAB — URINALYSIS W MICROSCOPIC + REFLEX CULTURE
Casts: NONE SEEN
Crystals: NONE SEEN
Glucose, UA: NEGATIVE mg/dL
Hgb urine dipstick: NEGATIVE
Leukocytes, UA: NEGATIVE
Nitrite: NEGATIVE
Specific Gravity, Urine: 1.006 (ref 1.005–1.030)
pH: 6.5 (ref 5.0–8.0)

## 2013-01-12 LAB — GLUCOSE, RANDOM: Glucose, Bld: 96 mg/dL (ref 70–99)

## 2013-01-12 LAB — LIPID PANEL: HDL: 46 mg/dL (ref >39)

## 2014-02-13 ENCOUNTER — Encounter: Payer: Self-pay | Admitting: Women's Health

## 2015-11-13 NOTE — Progress Notes (Signed)
This encounter was created in error - please disregard.

## 2016-06-10 NOTE — Progress Notes (Signed)
This encounter was created in error - please disregard.

## 2016-12-12 ENCOUNTER — Other Ambulatory Visit (HOSPITAL_COMMUNITY): Payer: Self-pay | Admitting: Otolaryngology

## 2016-12-12 DIAGNOSIS — T17308A Unspecified foreign body in larynx causing other injury, initial encounter: Secondary | ICD-10-CM

## 2016-12-12 DIAGNOSIS — R1313 Dysphagia, pharyngeal phase: Secondary | ICD-10-CM

## 2016-12-18 ENCOUNTER — Ambulatory Visit (HOSPITAL_COMMUNITY): Payer: Self-pay

## 2020-10-30 LAB — OB RESULTS CONSOLE ANTIBODY SCREEN: Antibody Screen: NEGATIVE

## 2020-10-30 LAB — OB RESULTS CONSOLE ABO/RH: RH Type: POSITIVE

## 2020-10-30 LAB — OB RESULTS CONSOLE HEPATITIS B SURFACE ANTIGEN: Hepatitis B Surface Ag: NEGATIVE

## 2020-10-30 LAB — OB RESULTS CONSOLE RUBELLA ANTIBODY, IGM: Rubella: IMMUNE

## 2020-10-30 LAB — OB RESULTS CONSOLE HIV ANTIBODY (ROUTINE TESTING): HIV: NONREACTIVE

## 2020-10-30 LAB — OB RESULTS CONSOLE GC/CHLAMYDIA
Chlamydia: NEGATIVE
Gonorrhea: NEGATIVE

## 2021-03-21 LAB — OB RESULTS CONSOLE RPR: RPR: NONREACTIVE

## 2021-03-27 ENCOUNTER — Other Ambulatory Visit: Payer: Self-pay | Admitting: Obstetrics and Gynecology

## 2021-03-27 DIAGNOSIS — O99013 Anemia complicating pregnancy, third trimester: Secondary | ICD-10-CM

## 2021-04-04 ENCOUNTER — Encounter (HOSPITAL_COMMUNITY): Payer: Self-pay

## 2021-04-10 ENCOUNTER — Non-Acute Institutional Stay (HOSPITAL_COMMUNITY)
Admission: RE | Admit: 2021-04-10 | Discharge: 2021-04-10 | Disposition: A | Discharge status: Home / Self Care | Payer: 59 | Source: Ambulatory Visit | Attending: Internal Medicine | Admitting: Internal Medicine

## 2021-04-10 ENCOUNTER — Other Ambulatory Visit: Payer: Self-pay

## 2021-04-10 DIAGNOSIS — O99013 Anemia complicating pregnancy, third trimester: Secondary | ICD-10-CM

## 2021-04-10 MED ORDER — SODIUM CHLORIDE 0.9 % IV SOLN
500.0000 mg | INTRAVENOUS | Status: DC
  Administered 2021-04-10: 09:00:00 500 mg via INTRAVENOUS
  Filled 2021-04-10: qty 25

## 2021-04-10 MED ORDER — SODIUM CHLORIDE 0.9 % IV SOLN: INTRAVENOUS | Status: DC | PRN

## 2021-04-10 MED ORDER — DIPHENHYDRAMINE HCL 25 MG PO CAPS
25.0000 mg | ORAL_CAPSULE | ORAL | Status: DC
  Administered 2021-04-10: 09:00:00 25 mg via ORAL
  Filled 2021-04-10: qty 1

## 2021-04-10 MED ORDER — ACETAMINOPHEN 500 MG PO TABS
500.0000 mg | ORAL_TABLET | ORAL | Status: DC
  Administered 2021-04-10: 09:00:00 500 mg via ORAL
  Filled 2021-04-10: qty 1

## 2021-04-10 NOTE — Progress Notes (Signed)
Patient received IV Venofer ( dose #1 of 2) as ordered by Rhoderick Moody  MD. Pt received premedications Tylenol and Benadryl as ordered.  Tolerated well, vitals stable, discharge instructions given , verbalized understanding. Pt instructed to schedule next infusion for 2 weeks at front desk prior to leaving, pt verbalized understanding. Patient alert, oriented, and ambulatory at the time of discharge.

## 2021-04-14 NOTE — L&D Delivery Note (Signed)
° °  Delivery Note:   G4P3 at [redacted]w[redacted]d  Admitting diagnosis: Encounter for maternal care for low transverse scar from previous cesarean delivery [O34.211] Risks:  Hx C/S G2 for twins, G1 was NSVB uncomplicated AMA 41 yo Severe IDA treated with IV Venofer x 2 doses, hgb at lowest 8.9, increased to 10.4 at 36 wks  First Stage:  Induction of labor:started with OF placement in office 2/14 and membrane sweep 2/15 Onset of labor: 2/16 evening Augmentation: Pitocin and OP Foley ROM: SROM 2/17 between 12pm and 3pm Active labor onset: 0500 2/17 Analgesia /Anesthesia/Pain control intrapartum: Nitrous gas and Epidural   Second Stage:  Complete dilation at 05/31/2021  1911 Onset of pushing at 1915 FHR second stage 1925   Pushing in R lateral position with CNM and L&D staff support, [name] spouse and Dewayne Hatch doula present for birth and supportive. Nuchal Cord: No  Delivery of a Live born female  Birth Weight:   APGAR: ,   Newborn Delivery   Birth date/time: 05/31/2021 19:25:00 Delivery type: VBAC, Spontaneous      in cephalic presentation, position OA and no restitution.. Shoulders delivered in horizontal plane easily following head.  Cord double clamped after cessation of pulsation, cut by [name].  Collection of cord blood for typing completed. Cord blood donation-None  Arterial cord blood sample-No    Third Stage:  Placenta delivered-Spontaneous  with 3 vessels . Uterine tone firm, bleeding moderate. Uterotonics: Pitocin IV bolus Placenta to parents per request.  1st degree perineal and R labia minora transection laceration identified with brisk bleed.  Episiotomy:None  Local analgesia: NA  Repair:3.0 vicryl figure eight in deeper tissue supporting labia to control bleed then repaired in baseball stitch fashion. Good approximation and hemostasis. Perineal laceration repaired in standard fashion.  Est. Blood Loss (mL):300.00   Complications: None   Mom to postpartum.  Baby  [name] to Couplet care / Skin to Skin.  Delivery Report:  Review the Delivery Report for details.     Signed: Neta Mends, CNM, MSN 05/31/2021, 7:59 PM

## 2021-04-25 ENCOUNTER — Other Ambulatory Visit: Payer: Self-pay

## 2021-04-25 ENCOUNTER — Non-Acute Institutional Stay (HOSPITAL_COMMUNITY)
Admission: RE | Admit: 2021-04-25 | Discharge: 2021-04-25 | Disposition: A | Discharge status: Home / Self Care | Payer: 59 | Source: Ambulatory Visit | Attending: Internal Medicine | Admitting: Internal Medicine

## 2021-04-25 DIAGNOSIS — Z3A00 Weeks of gestation of pregnancy not specified: Secondary | ICD-10-CM | POA: Diagnosis not present

## 2021-04-25 DIAGNOSIS — O99013 Anemia complicating pregnancy, third trimester: Secondary | ICD-10-CM | POA: Insufficient documentation

## 2021-04-25 MED ORDER — DIPHENHYDRAMINE HCL 25 MG PO CAPS
25.0000 mg | ORAL_CAPSULE | Freq: Once | ORAL | Status: AC
  Administered 2021-04-25: 25 mg via ORAL
  Filled 2021-04-25: qty 1

## 2021-04-25 MED ORDER — SODIUM CHLORIDE 0.9 % IV SOLN: INTRAVENOUS | Status: DC | PRN

## 2021-04-25 MED ORDER — ACETAMINOPHEN 500 MG PO TABS
500.0000 mg | ORAL_TABLET | Freq: Once | ORAL | Status: AC
  Administered 2021-04-25: 500 mg via ORAL
  Filled 2021-04-25: qty 1

## 2021-04-25 MED ORDER — SODIUM CHLORIDE 0.9 % IV SOLN
500.0000 mg | Freq: Once | INTRAVENOUS | Status: AC
  Administered 2021-04-25: 500 mg via INTRAVENOUS
  Filled 2021-04-25: qty 25

## 2021-04-25 NOTE — Progress Notes (Signed)
PATIENT CARE CENTER NOTE   Diagnosis: Anemia complicating pregnancy in third trimester (O99.013)   Provider: Tiana Loft, MD   Procedure: Venofer infusion    Note:  Patient received 500 mg Venofer infusion (dose # 2 of 2) via PIV. Pre medications given (Tylenol and Benadryl) per order. Patient tolerated infusion well with no adverse reaction. Vital signs stable. AVS offered but patient refused. Patient alert, oriented and ambulatory at discharge.

## 2021-04-30 LAB — OB RESULTS CONSOLE GBS: GBS: NEGATIVE

## 2021-05-31 ENCOUNTER — Inpatient Hospital Stay (HOSPITAL_COMMUNITY): Payer: 59 | Admitting: Anesthesiology

## 2021-05-31 ENCOUNTER — Other Ambulatory Visit: Payer: Self-pay

## 2021-05-31 ENCOUNTER — Inpatient Hospital Stay (HOSPITAL_COMMUNITY)
Admission: AD | Admit: 2021-05-31 | Discharge: 2021-06-01 | DRG: 807 | Disposition: A | Discharge status: Home / Self Care | Payer: 59 | Attending: Obstetrics and Gynecology | Admitting: Obstetrics and Gynecology

## 2021-05-31 ENCOUNTER — Encounter (HOSPITAL_COMMUNITY): Payer: Self-pay

## 2021-05-31 DIAGNOSIS — O480 Post-term pregnancy: Secondary | ICD-10-CM | POA: Diagnosis present

## 2021-05-31 DIAGNOSIS — Z20822 Contact with and (suspected) exposure to covid-19: Secondary | ICD-10-CM | POA: Diagnosis present

## 2021-05-31 DIAGNOSIS — Z3A40 40 weeks gestation of pregnancy: Secondary | ICD-10-CM | POA: Diagnosis not present

## 2021-05-31 DIAGNOSIS — O34211 Maternal care for low transverse scar from previous cesarean delivery: Principal | ICD-10-CM | POA: Diagnosis present

## 2021-05-31 DIAGNOSIS — D509 Iron deficiency anemia, unspecified: Secondary | ICD-10-CM | POA: Diagnosis present

## 2021-05-31 DIAGNOSIS — O34219 Maternal care for unspecified type scar from previous cesarean delivery: Secondary | ICD-10-CM | POA: Diagnosis present

## 2021-05-31 DIAGNOSIS — O9902 Anemia complicating childbirth: Secondary | ICD-10-CM | POA: Diagnosis present

## 2021-05-31 HISTORY — DX: Other specified health status: Z78.9

## 2021-05-31 LAB — CBC
HCT: 37.1 % (ref 36.0–46.0)
Hemoglobin: 12.3 g/dL (ref 12.0–15.0)
MCH: 28.5 pg (ref 26.0–34.0)
MCHC: 33.2 g/dL (ref 30.0–36.0)
MCV: 85.9 fL (ref 80.0–100.0)
Platelets: 235 10*3/uL (ref 150–400)
RBC: 4.32 MIL/uL (ref 3.87–5.11)
RDW: 15.9 % — ABNORMAL HIGH (ref 11.5–15.5)
WBC: 17.3 10*3/uL — ABNORMAL HIGH (ref 4.0–10.5)
nRBC: 0 % (ref 0.0–0.2)

## 2021-05-31 LAB — TYPE AND SCREEN
ABO/RH(D): O POS
Antibody Screen: NEGATIVE

## 2021-05-31 LAB — RESP PANEL BY RT-PCR (FLU A&B, COVID) ARPGX2
Influenza A by PCR: NEGATIVE
Influenza B by PCR: NEGATIVE
SARS Coronavirus 2 by RT PCR: NEGATIVE

## 2021-05-31 LAB — RPR: RPR Ser Ql: NONREACTIVE

## 2021-05-31 MED ORDER — IBUPROFEN 600 MG PO TABS
600.0000 mg | ORAL_TABLET | Freq: Four times a day (QID) | ORAL | Status: DC
  Filled 2021-05-31 (×2): qty 1

## 2021-05-31 MED ORDER — SODIUM CHLORIDE 0.9% FLUSH: 3.0000 mL | INTRAVENOUS | Status: DC | PRN

## 2021-05-31 MED ORDER — SIMETHICONE 80 MG PO CHEW: 80.0000 mg | CHEWABLE_TABLET | ORAL | Status: DC | PRN

## 2021-05-31 MED ORDER — DIPHENHYDRAMINE HCL 25 MG PO CAPS: 25.0000 mg | ORAL_CAPSULE | Freq: Four times a day (QID) | ORAL | Status: DC | PRN

## 2021-05-31 MED ORDER — ONDANSETRON HCL 4 MG/2ML IJ SOLN: 4.0000 mg | INTRAMUSCULAR | Status: DC | PRN

## 2021-05-31 MED ORDER — LIDOCAINE HCL (PF) 1 % IJ SOLN
INTRAMUSCULAR | Status: DC | PRN
  Administered 2021-05-31: 8 mL via EPIDURAL

## 2021-05-31 MED ORDER — BISACODYL 10 MG RE SUPP: 10.0000 mg | Freq: Every day | RECTAL | Status: DC | PRN

## 2021-05-31 MED ORDER — DIBUCAINE (PERIANAL) 1 % EX OINT: 1.0000 "application " | TOPICAL_OINTMENT | CUTANEOUS | Status: DC | PRN

## 2021-05-31 MED ORDER — ACETAMINOPHEN 325 MG PO TABS: 650.0000 mg | ORAL_TABLET | ORAL | Status: DC | PRN

## 2021-05-31 MED ORDER — EPHEDRINE 5 MG/ML INJ
10.0000 mg | INTRAVENOUS | Status: DC | PRN
  Filled 2021-05-31: qty 5

## 2021-05-31 MED ORDER — OXYTOCIN-SODIUM CHLORIDE 30-0.9 UT/500ML-% IV SOLN
1.0000 m[IU]/min | INTRAVENOUS | Status: DC
  Administered 2021-05-31: 2 m[IU]/min via INTRAVENOUS
  Filled 2021-05-31: qty 500

## 2021-05-31 MED ORDER — OXYTOCIN-SODIUM CHLORIDE 30-0.9 UT/500ML-% IV SOLN: 2.5000 [IU]/h | INTRAVENOUS | Status: DC

## 2021-05-31 MED ORDER — PHENYLEPHRINE 40 MCG/ML (10ML) SYRINGE FOR IV PUSH (FOR BLOOD PRESSURE SUPPORT): 80.0000 ug | PREFILLED_SYRINGE | INTRAVENOUS | Status: DC | PRN

## 2021-05-31 MED ORDER — SENNOSIDES-DOCUSATE SODIUM 8.6-50 MG PO TABS
2.0000 | ORAL_TABLET | ORAL | Status: DC
  Administered 2021-06-01: 2 via ORAL
  Filled 2021-05-31: qty 2

## 2021-05-31 MED ORDER — LACTATED RINGERS IV SOLN: 500.0000 mL | Freq: Once | INTRAVENOUS | Status: DC

## 2021-05-31 MED ORDER — LIDOCAINE HCL (PF) 1 % IJ SOLN: 30.0000 mL | INTRAMUSCULAR | Status: DC | PRN

## 2021-05-31 MED ORDER — FENTANYL CITRATE (PF) 100 MCG/2ML IJ SOLN: 50.0000 ug | INTRAMUSCULAR | Status: DC | PRN

## 2021-05-31 MED ORDER — OXYTOCIN BOLUS FROM INFUSION
333.0000 mL | Freq: Once | INTRAVENOUS | Status: AC
  Administered 2021-05-31: 333 mL via INTRAVENOUS

## 2021-05-31 MED ORDER — EPHEDRINE 5 MG/ML INJ
10.0000 mg | INTRAVENOUS | Status: AC | PRN
  Administered 2021-05-31 (×2): 10 mg via INTRAVENOUS

## 2021-05-31 MED ORDER — DIPHENHYDRAMINE HCL 50 MG/ML IJ SOLN: 12.5000 mg | INTRAMUSCULAR | Status: DC | PRN

## 2021-05-31 MED ORDER — ONDANSETRON HCL 4 MG/2ML IJ SOLN: 4.0000 mg | Freq: Four times a day (QID) | INTRAMUSCULAR | Status: DC | PRN

## 2021-05-31 MED ORDER — FENTANYL-BUPIVACAINE-NACL 0.5-0.125-0.9 MG/250ML-% EP SOLN
12.0000 mL/h | EPIDURAL | Status: DC | PRN
  Administered 2021-05-31: 12 mL/h via EPIDURAL
  Filled 2021-05-31: qty 250

## 2021-05-31 MED ORDER — ACETAMINOPHEN 500 MG PO TABS
1000.0000 mg | ORAL_TABLET | Freq: Four times a day (QID) | ORAL | Status: DC
  Filled 2021-05-31 (×2): qty 2

## 2021-05-31 MED ORDER — OXYTOCIN 10 UNIT/ML IJ SOLN: 10.0000 [IU] | Freq: Once | INTRAMUSCULAR | Status: DC

## 2021-05-31 MED ORDER — LACTATED RINGERS IV SOLN: INTRAVENOUS | Status: DC

## 2021-05-31 MED ORDER — TERBUTALINE SULFATE 1 MG/ML IJ SOLN: 0.2500 mg | Freq: Once | INTRAMUSCULAR | Status: DC | PRN

## 2021-05-31 MED ORDER — COCONUT OIL OIL: 1.0000 "application " | TOPICAL_OIL | Status: DC | PRN

## 2021-05-31 MED ORDER — TETANUS-DIPHTH-ACELL PERTUSSIS 5-2.5-18.5 LF-MCG/0.5 IM SUSY: 0.5000 mL | PREFILLED_SYRINGE | Freq: Once | INTRAMUSCULAR | Status: DC

## 2021-05-31 MED ORDER — SODIUM CHLORIDE 0.9% FLUSH: 3.0000 mL | Freq: Two times a day (BID) | INTRAVENOUS | Status: DC

## 2021-05-31 MED ORDER — BENZOCAINE-MENTHOL 20-0.5 % EX AERO
1.0000 "application " | INHALATION_SPRAY | CUTANEOUS | Status: DC | PRN
  Filled 2021-05-31: qty 56

## 2021-05-31 MED ORDER — ONDANSETRON HCL 4 MG PO TABS: 4.0000 mg | ORAL_TABLET | ORAL | Status: DC | PRN

## 2021-05-31 MED ORDER — METHYLERGONOVINE MALEATE 0.2 MG PO TABS: 0.2000 mg | ORAL_TABLET | ORAL | Status: DC | PRN

## 2021-05-31 MED ORDER — WITCH HAZEL-GLYCERIN EX PADS: 1.0000 "application " | MEDICATED_PAD | CUTANEOUS | Status: DC | PRN

## 2021-05-31 MED ORDER — LACTATED RINGERS IV SOLN: 500.0000 mL | INTRAVENOUS | Status: DC | PRN

## 2021-05-31 MED ORDER — METHYLERGONOVINE MALEATE 0.2 MG/ML IJ SOLN: 0.2000 mg | INTRAMUSCULAR | Status: DC | PRN

## 2021-05-31 MED ORDER — SODIUM CHLORIDE 0.9 % IV SOLN: 250.0000 mL | INTRAVENOUS | Status: DC | PRN

## 2021-05-31 MED ORDER — FENTANYL-BUPIVACAINE-NACL 0.5-0.125-0.9 MG/250ML-% EP SOLN: 12.0000 mL/h | EPIDURAL | Status: DC | PRN

## 2021-05-31 MED ORDER — FLEET ENEMA 7-19 GM/118ML RE ENEM: 1.0000 | ENEMA | Freq: Every day | RECTAL | Status: DC | PRN

## 2021-05-31 MED ORDER — PRENATAL MULTIVITAMIN CH
1.0000 | ORAL_TABLET | Freq: Every day | ORAL | Status: DC
  Filled 2021-05-31: qty 1

## 2021-05-31 MED ORDER — ZOLPIDEM TARTRATE 5 MG PO TABS: 5.0000 mg | ORAL_TABLET | Freq: Every evening | ORAL | Status: DC | PRN

## 2021-05-31 MED ORDER — SOD CITRATE-CITRIC ACID 500-334 MG/5ML PO SOLN: 30.0000 mL | ORAL | Status: DC | PRN

## 2021-05-31 NOTE — Lactation Note (Signed)
This note was copied from a baby's chart. Lactation Consultation Note  Patient Name: Girl Danene Montijo RWERX'V Date: 05/31/2021 Reason for consult: L&D Initial assessment Age:43 hours P4, term female infant. Mom decided to be seen by [facility] services in L&D. LC entered the room, mom hand infant latched on her left breast using the football hold position, infant was swaddled in blankets, infant had been breastfeeding for 15 minutes , prior to [facility] entering the room. LC discussed the importance of skin to skin and mom unwrapped infant, infant sustained latch, and BF for total of 25 minutes.  Mom will attempt to latch infant on both breast during a feeding. Mom knows to breastfeed infant according to primal cues, 8 to 12+ times within 24 hours, skin to skin. Mom knows to call RN/LC on MBU if she need further latch assistance. Maternal Data Does the patient have breastfeeding experience prior to this delivery?: Yes How long did the patient breastfeed?: Per mom, she BF 1st child for one year, twins she breastfeed for 2 years 6 months, the twins are now 43 years old.  Feeding Mother's Current Feeding Choice: Breast Milk  LATCH Score Latch: Grasps breast easily, tongue down, lips flanged, rhythmical sucking.  Audible Swallowing: A few with stimulation  Type of Nipple: Everted at rest and after stimulation  Comfort (Breast/Nipple): Soft / non-tender  Hold (Positioning): No assistance needed to correctly position infant at breast.  LATCH Score: 9   Lactation Tools Discussed/Used    Interventions Interventions: Skin to skin;Breast compression;Adjust position;Support pillows;Education  Discharge    Consult Status Consult Status: Follow-up from L&D    Danelle Earthly 05/31/2021, 8:30 PM

## 2021-05-31 NOTE — Anesthesia Preprocedure Evaluation (Signed)
Anesthesia Evaluation  °Patient identified by MRN, date of birth, ID band °Patient awake ° ° ° °Reviewed: °Allergy & Precautions, H&P , NPO status , Patient's Chart, lab work & pertinent test results, reviewed documented beta blocker date and time  ° °Airway °Mallampati: II ° °TM Distance: >3 FB °Neck ROM: full ° ° ° Dental °no notable dental hx. ° °  °Pulmonary °neg pulmonary ROS,  °  °Pulmonary exam normal °breath sounds clear to auscultation ° ° ° ° ° ° Cardiovascular °negative cardio ROS °Normal cardiovascular exam °Rhythm:regular Rate:Normal ° ° °  °Neuro/Psych °negative neurological ROS ° negative psych ROS  ° GI/Hepatic °negative GI ROS, Neg liver ROS,   °Endo/Other  °negative endocrine ROS ° Renal/GU °negative Renal ROS  °negative genitourinary °  °Musculoskeletal ° ° Abdominal °  °Peds ° Hematology °negative hematology ROS °(+)   °Anesthesia Other Findings ° ° Reproductive/Obstetrics °(+) Pregnancy ° °  ° ° ° ° ° ° ° ° ° ° ° ° ° °  °  ° ° ° ° ° ° ° ° °Anesthesia Physical °Anesthesia Plan ° °ASA: 2 ° °Anesthesia Plan: Epidural  ° °Post-op Pain Management:   ° °Induction:  ° °PONV Risk Score and Plan: 2 ° °Airway Management Planned: Natural Airway ° °Additional Equipment: None ° °Intra-op Plan:  ° °Post-operative Plan:  ° °Informed Consent: I have reviewed the patients History and Physical, chart, labs and discussed the procedure including the risks, benefits and alternatives for the proposed anesthesia with the patient or authorized representative who has indicated his/her understanding and acceptance.  ° ° ° °Dental Advisory Given ° °Plan Discussed with: Anesthesiologist ° °Anesthesia Plan Comments: (Labs checked- platelets confirmed with RN in room. Fetal heart tracing, per RN, reported to be stable enough for sitting procedure. °Discussed epidural, and patient consents to the procedure:  included risk of possible headache,backache, failed block, allergic reaction, and  nerve injury. This patient was asked if she had any questions or concerns before the procedure started.)  ° ° ° ° ° °Anesthesia Quick Evaluation ° °

## 2021-05-31 NOTE — Progress Notes (Signed)
Kristina Fernandez is a 41 y.o. G4P3 at [redacted]w[redacted]d by LMP admitted for  labor / TOLAC  Subjective: Patient reports ctx more intense and desires cervical exam. Reports small gushes of fluid with ctx, thought she was voiding involuntarily.  Using nitrous gas and reports effective but still having a hard time relaxing with ctx. Doula and spouse [name] present and supportive.   Objective: Vitals:   05/31/21 1111 05/31/21 1116 05/31/21 1257  BP:  (!) 109/55 (!) 119/56  Pulse:  85 73  Resp:  20 18  Temp:  98.3 F (36.8 C)   TempSrc:  Oral   Weight: 95.3 kg    Height: 5\' 4"  (1.626 m)       FHT:  FHR: 140 bpm, variability: moderate,  accelerations:  Present,  decelerations: occasional small variables UC:   irregular, every 2-4 minutes SVE:   Dilation: 6.5 Effacement (%): 90 Station: -1 Exam by:: D Meya Clutter CNM Caput noted, no membranes felt, minimal fluid, tan color  Labs:   Recent Labs    05/31/21 1104  WBC 17.3*  HGB 12.3  HCT 37.1  PLT 235    Assessment / Plan: G35P3 41 y.o. [redacted]w[redacted]d Protracted active phase, TOLAC  Labor:  Slow progress, SROM in last few hours with minimal fluid on exam.  Discussed option for augmentation with Pitocin and IUPC monitoring for adequate titration. Also recommend epidural at this time to facilitate rest, position changes and pelvic relaxation. R/B of option reviewed and patient and spouse agree.  Will start low  dose Pitocin protocol once comfortable with epidural Preeclampsia:  no signs or symptoms of toxicity Fetal Wellbeing:  Category I Pain Control:   prep for epidural I/D:  GBS neg, afebrile Anticipated MOD:   VBAC  Neta Mends, CNM, MSN 05/31/2021, 3:13 PM

## 2021-05-31 NOTE — Anesthesia Procedure Notes (Signed)
Epidural Patient location during procedure: OB Start time: 05/31/2021 3:35 PM End time: 05/31/2021 3:40 PM  Staffing Anesthesiologist: Janeece Riggers, MD  Preanesthetic Checklist Completed: patient identified, IV checked, site marked, risks and benefits discussed, surgical consent, monitors and equipment checked, pre-op evaluation and timeout performed  Epidural Patient position: sitting Prep: DuraPrep and site prepped and draped Patient monitoring: continuous pulse ox and blood pressure Approach: midline Location: L4-L5 Injection technique: LOR air  Needle:  Needle type: Tuohy  Needle gauge: 17 G Needle length: 9 cm and 9 Needle insertion depth: 6 cm Catheter type: closed end flexible Catheter size: 19 Gauge Catheter at skin depth: 12 cm Test dose: negative  Assessment Events: blood not aspirated, injection not painful, no injection resistance, no paresthesia and negative IV test

## 2021-05-31 NOTE — H&P (Signed)
OB ADMISSION/ HISTORY & PHYSICAL:  Admission Date: 05/31/2021 10:40 AM  Admit Diagnosis: Encounter for maternal care for low transverse scar from previous cesarean delivery [O34.211]    Kristina Fernandez is a 41 y.o. female presenting for active labor. Ctx started yesterday afternoon, progressive thorough the night, minimal rest, started increased frequency and strength this morning, working through ctx with relaxation techniques. Spouse [name] present and supportive. Desires TOLAC, unmedicated labor, consent signed. S/P OF placed 2/14 and expelled the next day, followed by membrane sweep.  Reports good FM, no LOF. Has been feeling lots of lower SP and hip pain, + nausea, no emesis.  Expecting baby girl [place]   Prenatal History: G4P3   EDC : 05/28/2021, by Other Basis  Prenatal care at [facility] since 10 wks   Prenatal course complicated by: Hx C/S G2 for twins, G1 was NSVB uncomplicated AMA 41 yo Severe IDA treated with IV Venofer x 2 doses, hgb at lowest 8.9, increased to 10.4 at 36 wks  Prenatal Labs: ABO, Rh: O (07/19 0000)  Antibody: Negative (07/19 0000) Rubella: Immune (07/19 0000)  RPR: Nonreactive (12/08 0000)  HBsAg: Negative (07/19 0000)  HIV: Non-reactive (07/19 0000)  GBS: Negative/-- (01/17 0000)  1 hr Glucola : 122 Genetic Screening: NIPT LR, AFP1 declined Ultrasound: normal female anatomy, posterior placenta, 3VC, EFW 12oz@ 96%, AC 82%, HC 65%, CL 4.6cm. ANFT  BPP weekly after 36 wks and 8/8, last AFI 8.3 cm  Vaccines: TDaP          declined         Flu             declined                    COVID-19 declined  Maternal Diabetes: No Genetic Screening: Normal Maternal Ultrasounds/Referrals: Normal Fetal Ultrasounds or other Referrals:  None Maternal Substance Abuse:  No Significant Maternal Medications:  Meds include: Other: IV and PO iron Significant Maternal Lab Results:  Group B Strep negative Other Comments:  None  Medical /  Surgical History :  Past medical history: No past medical history on file.   Past surgical history:  Past Surgical History:  Procedure Laterality Date   BACK SURGERY  2004   HERNIATED DISC   CESAREAN SECTION  [id number]   DELIVERED TWINS   WISDOM TOOTH EXTRACTION  1998     Family History:  Family History  Problem Relation Age of Onset   Hypertension Mother    Breast cancer Maternal Aunt      Social History:  reports that she has never smoked. She has never used smokeless tobacco. She reports current alcohol use. She reports that she does not use drugs.  Allergies: Patient has no known allergies.   Current Medications at time of admission:  No medications prior to admission.     Review of Systems: ROS  Physical Exam: Vital signs and nursing notes reviewed.  No data found.   General: AAO x 3, NAD, coping suboptimal Heart: RRR Lungs:CTAB Abdomen: Gravid, NT, Leopold's vertex, fetal spine to maternal R Extremities: no edema Genitalia / VE: Dilation: 5 Effacement (%): 90 Cervical Position: Anterior Station: -1 Presentation: Vertex Exam by:: D. Renae Fickle, CNM   FHR: 130 BPM, mod variability, + accels, no decels TOCO: Ctx 2-3 min  Labs:   Pending T&S, CBC, RPR  No results for input(s): WBC, HGB, HCT, PLT in the last 72 hours.   Assessment/Plan:  40 y.o. G4P3 at [redacted]w[redacted]d Hx C/S for TOLAC, consent signed AMA 41 yo IDA on oral Fe. S/P IV Venofer at 32 wks  Fetal wellbeing - FHT category 1 EFW 8 lbs, AGA  Labor: active, will continue expectant management Encouraged position changes and relaxation methods May consider AROM PRN  GBS neg Rubella imm Rh pos  Pain control: desires unmedicated labor, offered nitrous gas for pain relief and agrees Analgesia/anesthesia PRN  Anticipated MOD: VBAC  Plans to breastfeed POC discussed with patient and support team, all questions answered.  Dr Conni Elliot notified of admission / plan of care   Neta Mends CNM,  MSN 05/31/2021, 11:05 AM

## 2021-06-01 LAB — CBC
HCT: 30.9 % — ABNORMAL LOW (ref 36.0–46.0)
Hemoglobin: 10.4 g/dL — ABNORMAL LOW (ref 12.0–15.0)
MCH: 28.4 pg (ref 26.0–34.0)
MCHC: 33.7 g/dL (ref 30.0–36.0)
MCV: 84.4 fL (ref 80.0–100.0)
Platelets: 184 10*3/uL (ref 150–400)
RBC: 3.66 MIL/uL — ABNORMAL LOW (ref 3.87–5.11)
RDW: 15.8 % — ABNORMAL HIGH (ref 11.5–15.5)
WBC: 13.7 10*3/uL — ABNORMAL HIGH (ref 4.0–10.5)
nRBC: 0 % (ref 0.0–0.2)

## 2021-06-01 NOTE — Discharge Summary (Signed)
OB Discharge Summary  Patient Name: Kristina Fernandez DOB: 1980/05/21 MRN: [id number]  Date of admission: 05/31/2021 Delivering provider: Neta Mends   Admitting diagnosis: Encounter for maternal care for low transverse scar from previous cesarean delivery [O34.211] Intrauterine pregnancy: [redacted]w[redacted]d     Secondary diagnosis: Patient Active Problem List   Diagnosis Date Noted   History of cesarean delivery, for [facility] 05/31/2021   Encounter for maternal care for low transverse scar from previous cesarean delivery 05/31/2021   VBAC 2/17 05/31/2021   Postpartum care following vaginal delivery 2/17 05/31/2021   First degree perineal laceration and R translabial 05/31/2021    Date of discharge: 06/01/2021   Discharge diagnosis: Principal Problem:   Postpartum care following vaginal delivery 2/17 Active Problems:   History of cesarean delivery, for TOLAC   Encounter for maternal care for low transverse scar from previous cesarean delivery   VBAC 2/17   First degree perineal laceration and R translabial                                                           Post partum procedures: None  Augmentation: Pitocin Pain control: Epidural  Laceration:1st degree;Labial  Episiotomy:None  Complications: None  Hospital course:  Onset of Labor With Vaginal Delivery      41 y.o. yo G4P1001 at [redacted]w[redacted]d was admitted in Active Labor on 05/31/2021. Patient had an uncomplicated labor course as follows:  Membrane Rupture Time/Date: 2:53 PM ,05/31/2021   Delivery Method:VBAC, Spontaneous  Episiotomy: None  Lacerations:  1st degree;Labial  Patient had an uncomplicated postpartum course.  She is ambulating, tolerating a regular diet, passing flatus, and urinating well. Patient is discharged home in stable condition on 06/01/21.  Newborn Data: Birth date:05/31/2021  Birth time:7:25 PM  Gender:Female  Living status:Living  Apgars:9 ,9  Weight:3884 g   Physical exam  Vitals:   06/01/21 0430 06/01/21 0950  06/01/21 1500 06/01/21 2020  BP: (!) 109/51 (!) 99/52 (!) 97/52 (!) 106/52  Pulse: 65 65 71 74  Resp: 17 18 18 16   Temp: 98.3 F (36.8 C) 98.2 F (36.8 C) 98.2 F (36.8 C) 99 F (37.2 C)  TempSrc: Oral Oral Oral Oral  SpO2: 100% 97% 97% 96%  Weight:      Height:       General: alert, cooperative, and no distress Lochia: appropriate Uterine Fundus: firm Incision: N/A Perineum: repair intact, no edema DVT Evaluation: No evidence of DVT seen on physical exam.  Labs: Lab Results  Component Value Date   WBC 13.7 (H) 06/01/2021   HGB 10.4 (L) 06/01/2021   HCT 30.9 (L) 06/01/2021   MCV 84.4 06/01/2021   PLT 184 06/01/2021   CMP Latest Ref Rng & Units 01/11/2013  Glucose 70 - 99 mg/dL 96   Edinburgh Postnatal Depression Scale Screening Tool 06/01/2021  I have been able to laugh and see the funny side of things. 0  I have looked forward with enjoyment to things. 0  I have blamed myself unnecessarily when things went wrong. 1  I have been anxious or worried for no good reason. 1  I have felt scared or panicky for no good reason. 0  Things have been getting on top of me. 1  I have been so unhappy that I have had difficulty sleeping. 0  I have felt sad or miserable. 0  I have been so unhappy that I have been crying. 0  The thought of harming myself has occurred to me. 0  Edinburgh Postnatal Depression Scale Total 3   Discharge instructions:  per After Visit Summary  After Visit Meds:  Allergies as of 06/01/2021   No Known Allergies      Medication List    You have not been prescribed any medications.    Diet: routine diet  Activity: Advance as tolerated. Pelvic rest for 6 weeks.   Newborn Data: Live born female  Birth Weight: 8 lb 9 oz (3884 g) APGAR: 9, 9  Newborn Delivery   Birth date/time: 05/31/2021 19:25:00 Delivery type: VBAC, Spontaneous     Named [name] Baby Feeding: Breast Disposition:home with mother  Delivery Report:  Review the Delivery  Report for details.    Follow up:  Follow-up Information     Neta Mends, CNM. Schedule an appointment as soon as possible for a visit in 6 week(s).   Specialty: Obstetrics and Gynecology Contact information: [address] [place] [address] [phone]                Clancy Gourd, MSN 06/01/2021, 9:17 PM

## 2021-06-01 NOTE — Lactation Note (Addendum)
This note was copied from a baby's chart. Lactation Consultation Note  Patient Name: Kristina Fernandez [id number] Date: 06/01/2021 Reason for consult: Initial assessment;Term;Infant weight loss;Breastfeeding assistance;Other (Comment) Age:43 hours / exp BF of 3 other babies  As LC entered the room mom had just changed a large wet diaper.  Mom 1st latched the baby it was shallow . LC noted areola edema and baby sucking more on the nipple. LC recommended and showed mom reverse pressure prior to latch, areola more compressible. LC assisted and worked with baby to obtain depth at the breast, increased swallows noted with moist warm heat compress and breast compressions.  LC noted the baby to have a recessed chin and LC recommended the football or cross cradle position to work with the baby to latch deeply.  Per mom may desire to go home early. Latch score 7   Maternal Data Has patient been taught Hand Expression?: Yes  Feeding Mother's Current Feeding Choice: Breast Milk  LATCH Score Latch: Repeated attempts needed to sustain latch, nipple held in mouth throughout feeding, stimulation needed to elicit sucking reflex.  Audible Swallowing: A few with stimulation  Type of Nipple: Everted at rest and after stimulation  Comfort (Breast/Nipple): Soft / non-tender  Hold (Positioning): Assistance needed to correctly position infant at breast and maintain latch.  LATCH Score: 7   Lactation Tools Discussed/Used    Interventions Interventions: Breast feeding basics reviewed;Assisted with latch;Skin to skin;Breast massage;Hand express;Reverse pressure;Breast compression;Adjust position;Support pillows;Position options;DEBP;Education  Discharge Pump: DEBP;Personal  Consult Status Consult Status: Follow-up Date: 06/02/21 Follow-up type: In-patient    [place]ives 06/01/2021, 9:28 AM

## 2021-06-01 NOTE — Care Plan (Signed)
Pt discharged home with self care this shift. Reviewed discharge paperwork, s/sxs of Preeclampsia, and reasons to call doctor or midwife, as well as follow up appointment instructions. Pt verbalizes understanding. Reviewed safe sleep and newborn care instructions. Pt verbalizes understanding. Pt ambulated to car with RN, husband, and baby. Condition stable and unchanged from previously noted.

## 2021-06-01 NOTE — Progress Notes (Signed)
PPD # 1 S/P VBAC  Live born female  Birth Weight: 8 lb 9 oz (3884 g) APGAR: 9, 9  Newborn Delivery   Birth date/time: 05/31/2021 19:25:00 Delivery type: VBAC, Spontaneous     Baby name: Kristina Fernandez  Delivering provider: Arlan Organ C   Episiotomy:None   Lacerations:1st degree;Labial   Feeding: breast  Pain control at delivery: Epidural   S:  Reports feeling well. Happy with birth experience. Desires early discharge.             Tolerating PO/No nausea or vomiting             Bleeding is light             Pain controlled with acetaminophen and ibuprofen (OTC)             Up ad lib/ambulatory/voiding without difficulties   O:  A & O x 3, in no apparent distress  Vitals:   05/31/21 2140 05/31/21 2240 06/01/21 0430 06/01/21 0950  BP: (!) 102/49 (!) 107/53 (!) 109/51 (!) 99/52  Pulse: 71 84 65 65  Resp: 18 18 17 18   Temp: 98.7 F (37.1 C) 98 F (36.7 C) 98.3 F (36.8 C) 98.2 F (36.8 C)  TempSrc: Oral Oral Oral Oral  SpO2: 100% 99% 100% 97%  Weight:      Height:       Recent Labs    05/31/21 1104 06/01/21 0443  WBC 17.3* 13.7*  HGB 12.3 10.4*  HCT 37.1 30.9*  PLT 235 184    Blood type: --/--/O POS (02/17 1100)  Rubella: Immune (07/19 0000)   I&O: I/O last 3 completed shifts: In: 0  Out: 1650 [Urine:1350; Blood:300]          No intake/output data recorded.   Gen: AAO x 3, NAD  Abdomen: soft, non-tender, non-distended            Fundus: firm, non-tender, U-1  Perineum: repair intact  Lochia: small  Extremities: no edema, no calf pain or tenderness   A/P:  PPD # 1 41 y.o., G4P1001  Principal Problem:   Postpartum care following vaginal delivery 2/17  Doing well - stable status  Routine post partum orders Active Problems:   History of cesarean delivery, for TOLAC   VBAC 2/17   First degree perineal laceration and R translabial  Discussed perineal care and comfort measures.   Will discharge this evening if baby is discharged by peds. Otherwise,  anticipate discharge tomorrow.   3/17, MSN, CNM 06/01/2021, 11:01 AM

## 2021-06-01 NOTE — Anesthesia Postprocedure Evaluation (Signed)
Anesthesia Post Note  Patient: Kristina Fernandez  Procedure(s) Performed: AN AD HOC LABOR EPIDURAL     Patient location during evaluation: Mother Baby Anesthesia Type: Epidural Level of consciousness: awake and alert and oriented Pain management: satisfactory to patient Vital Signs Assessment: post-procedure vital signs reviewed and stable Respiratory status: respiratory function stable Cardiovascular status: stable Postop Assessment: no headache, no backache, epidural receding, patient able to bend at knees, no signs of nausea or vomiting, adequate PO intake and able to ambulate Anesthetic complications: no   No notable events documented.  Last Vitals:  Vitals:   05/31/21 2240 06/01/21 0430  BP: (!) 107/53 (!) 109/51  Pulse: 84 65  Resp: 18 17  Temp: 36.7 C 36.8 C  SpO2: 99% 100%    Last Pain:  Vitals:   06/01/21 0430  TempSrc: Oral  PainSc: 0-No pain   Pain Goal:                   Eliyahu Bille

## 2021-06-11 ENCOUNTER — Telehealth (HOSPITAL_COMMUNITY): Payer: Self-pay | Admitting: *Deleted

## 2021-06-11 NOTE — Telephone Encounter (Signed)
Hospital Discharge Follow-Up Call:  Patient reports that she is well and has no concerns about her healing process.  EPDS today was 5 and she endorses this accurately reflects that she is doing well emotionally.  Patient says that baby is well and she has no concerns about baby's health.  She reports that baby sleeps in both a crib and a PackNPlay.  Reviewed ABCs of Safe Sleep.
